# Patient Record
Sex: Male | Born: 1960 | Race: Black or African American | Hispanic: No | Marital: Married | Smoking: Never smoker
Health system: Southern US, Community
[De-identification: ages and names within clinical notes are randomized; demographics above are authoritative.]

## PROBLEM LIST (undated history)

## (undated) DIAGNOSIS — N529 Male erectile dysfunction, unspecified: Secondary | ICD-10-CM

## (undated) HISTORY — DX: Male erectile dysfunction, unspecified: N52.9

---

## 2006-11-06 ENCOUNTER — Ambulatory Visit: Payer: Self-pay | Admitting: Family Medicine

## 2008-12-30 ENCOUNTER — Ambulatory Visit: Payer: Self-pay | Admitting: Family Medicine

## 2010-03-20 HISTORY — PX: COLONOSCOPY: SHX174

## 2010-08-23 ENCOUNTER — Encounter: Payer: Self-pay | Admitting: Family Medicine

## 2010-08-24 ENCOUNTER — Encounter: Payer: Self-pay | Admitting: Family Medicine

## 2010-08-24 ENCOUNTER — Ambulatory Visit (INDEPENDENT_AMBULATORY_CARE_PROVIDER_SITE_OTHER): Payer: 59 | Admitting: Family Medicine

## 2010-08-24 VITALS — BP 120/84 | HR 72 | Ht 74.0 in | Wt 232.0 lb

## 2010-08-24 DIAGNOSIS — Z0000 Encounter for general adult medical examination without abnormal findings: Secondary | ICD-10-CM

## 2010-08-24 DIAGNOSIS — N529 Male erectile dysfunction, unspecified: Secondary | ICD-10-CM | POA: Insufficient documentation

## 2010-08-24 LAB — CBC WITH DIFFERENTIAL/PLATELET
Basophils Absolute: 0 10*3/uL (ref 0.0–0.1)
Basophils Relative: 0 % (ref 0–1)
Eosinophils Absolute: 0.1 10*3/uL (ref 0.0–0.7)
MCH: 27.5 pg (ref 26.0–34.0)
MCHC: 33.6 g/dL (ref 30.0–36.0)
Monocytes Relative: 10 % (ref 3–12)
Neutro Abs: 1.6 10*3/uL — ABNORMAL LOW (ref 1.7–7.7)
Neutrophils Relative %: 44 % (ref 43–77)
Platelets: 259 10*3/uL (ref 150–400)
RDW: 13.7 % (ref 11.5–15.5)

## 2010-08-24 LAB — POCT URINALYSIS DIPSTICK
Bilirubin, UA: NEGATIVE
Blood, UA: NEGATIVE
Leukocytes, UA: NEGATIVE
Nitrite, UA: NEGATIVE
Protein, UA: NEGATIVE
Urobilinogen, UA: NEGATIVE
pH, UA: 5

## 2010-08-24 LAB — COMPREHENSIVE METABOLIC PANEL
AST: 21 U/L (ref 0–37)
Alkaline Phosphatase: 78 U/L (ref 39–117)
Glucose, Bld: 87 mg/dL (ref 70–99)
Potassium: 4.4 mEq/L (ref 3.5–5.3)
Sodium: 138 mEq/L (ref 135–145)
Total Bilirubin: 0.5 mg/dL (ref 0.3–1.2)
Total Protein: 8.1 g/dL (ref 6.0–8.3)

## 2010-08-24 LAB — LIPID PANEL
HDL: 88 mg/dL (ref >39)
LDL Cholesterol: 130 mg/dL — ABNORMAL HIGH (ref 0–99)
Triglycerides: 48 mg/dL (ref <150)
VLDL: 10 mg/dL (ref 0–40)

## 2010-08-24 NOTE — Patient Instructions (Signed)
I will call you with the blood work results.

## 2010-08-24 NOTE — Progress Notes (Signed)
  Subjective:    Patient ID: Wesley Mayo, male    DOB: [birth year], 50 y.o.   MRN: [id number]  HPI he is here for a complete examination. He has no particular concerns or complaints. He does occasionally use Cialis for his underlying erectile dysfunction however has not found the need to use it recently. His work is going well. He does exercise regularly. He does wear his seatbelt. He does have children and they are doing well.   Review of Systems  Constitutional: Negative.   HENT: Negative.   Eyes: Negative.   Respiratory: Negative.   Cardiovascular: Negative.   Gastrointestinal: Negative.   Genitourinary: Negative.   Musculoskeletal: Negative.   Neurological: Negative.   Hematological: Negative.   Psychiatric/Behavioral: Negative.        Objective:   Physical Exam BP 120/84  Pulse 72  Ht 6\' 2"  (1.88 m)  Wt 232 lb (105.235 kg)  BMI 29.79 kg/m2  General Appearance:    Alert, cooperative, no distress, appears stated age  Head:    Normocephalic, without obvious abnormality, atraumatic  Eyes:    PERRL, conjunctiva/corneas clear, EOM's intact, fundi    benign  Ears:    Normal TM's and external ear canals  Nose:   Nares normal, mucosa normal, no drainage or sinus   tenderness  Throat:   Lips, mucosa, and tongue normal; teeth and gums normal  Neck:   Supple, no lymphadenopathy;  thyroid:  no   enlargement/tenderness/nodules; no carotid   bruit or JVD  Back:    Spine nontender, no curvature, ROM normal, no CVA     tenderness  Lungs:     Clear to auscultation bilaterally without wheezes, rales or     ronchi; respirations unlabored  Chest Wall:    No tenderness or deformity   Heart:    Regular rate and rhythm, S1 and S2 normal, no murmur, rub   or gallop  Breast Exam:    No chest wall tenderness, masses or gynecomastia  Abdomen:     Soft, non-tender, nondistended, normoactive bowel sounds,    no masses, no hepatosplenomegaly  Genitalia:    Normal male external genitalia without  lesions.  Testicles without masses.  No inguinal hernias.     Extremities:   No clubbing, cyanosis or edema  Pulses:   2+ and symmetric all extremities  Skin:   Skin color, texture, turgor normal, no rashes or lesions  Lymph nodes:   Cervical, supraclavicular, and axillary nodes normal  Neurologic:   CNII-XII intact, normal strength, sensation and gait; reflexes 2+ and symmetric throughout          Psych:   Normal mood, affect, hygiene and grooming.           Assessment & Plan:  Normal exam with history of erectile dysfunction. Routine blood screening. Discussed PSA testing and he declined. I will also set him up for colonoscopy.

## 2010-08-26 ENCOUNTER — Telehealth: Payer: Self-pay

## 2010-08-26 NOTE — Telephone Encounter (Signed)
Called pt informed labs look good

## 2010-09-13 LAB — HM COLONOSCOPY: HM Colonoscopy: NORMAL

## 2012-11-13 ENCOUNTER — Encounter: Payer: Self-pay | Admitting: Internal Medicine

## 2012-11-26 ENCOUNTER — Ambulatory Visit (INDEPENDENT_AMBULATORY_CARE_PROVIDER_SITE_OTHER): Payer: 59 | Admitting: Family Medicine

## 2012-11-26 ENCOUNTER — Encounter: Payer: Self-pay | Admitting: Family Medicine

## 2012-11-26 VITALS — BP 124/82 | HR 83 | Ht 74.0 in | Wt 239.0 lb

## 2012-11-26 DIAGNOSIS — Z23 Encounter for immunization: Secondary | ICD-10-CM

## 2012-11-26 DIAGNOSIS — Z0000 Encounter for general adult medical examination without abnormal findings: Secondary | ICD-10-CM

## 2012-11-26 DIAGNOSIS — K219 Gastro-esophageal reflux disease without esophagitis: Secondary | ICD-10-CM

## 2012-11-26 DIAGNOSIS — R0683 Snoring: Secondary | ICD-10-CM

## 2012-11-26 DIAGNOSIS — R0609 Other forms of dyspnea: Secondary | ICD-10-CM

## 2012-11-26 LAB — LIPID PANEL
Cholesterol: 228 mg/dL — ABNORMAL HIGH (ref 0–200)
LDL Cholesterol: 112 mg/dL — ABNORMAL HIGH (ref 0–99)
Total CHOL/HDL Ratio: 2.2 Ratio
Triglycerides: 64 mg/dL (ref <150)
VLDL: 13 mg/dL (ref 0–40)

## 2012-11-26 LAB — CBC WITH DIFFERENTIAL/PLATELET
Basophils Relative: 0 % (ref 0–1)
Eosinophils Absolute: 0.2 10*3/uL (ref 0.0–0.7)
MCH: 28.1 pg (ref 26.0–34.0)
MCHC: 34.4 g/dL (ref 30.0–36.0)
Neutro Abs: 1.5 10*3/uL — ABNORMAL LOW (ref 1.7–7.7)
Neutrophils Relative %: 39 % — ABNORMAL LOW (ref 43–77)
Platelets: 265 10*3/uL (ref 150–400)
RBC: 5.19 MIL/uL (ref 4.22–5.81)

## 2012-11-26 LAB — COMPREHENSIVE METABOLIC PANEL
ALT: 27 U/L (ref 0–53)
AST: 21 U/L (ref 0–37)
Alkaline Phosphatase: 79 U/L (ref 39–117)
Sodium: 137 mEq/L (ref 135–145)
Total Bilirubin: 0.4 mg/dL (ref 0.3–1.2)
Total Protein: 8 g/dL (ref 6.0–8.3)

## 2012-11-26 NOTE — Progress Notes (Signed)
  Subjective:    Patient ID: Wesley Mayo, male    DOB: [birth year], 52 y.o.   MRN: [id number]  HPI He is here for complete examination. He does snore and his girlfriend says that he stops breathing sometimes as much is 30-40 seconds at a time. He occasionally will wake up tired. He does not follow sleep behind the wheel. He will occasionally fall asleep watching a movie. He does have reflux and uses Prilosec on an as-needed basis. He has no other concerns or complaints. His medical record was reviewed. He is up-to-date on his colonoscopy, immunizations and blood work. His work and home life are going quite well. He has started a walking exercise program.   Review of Systems  Constitutional: Negative.   HENT: Negative.   Eyes: Negative.   Respiratory: Negative.   Cardiovascular: Negative.   Gastrointestinal: Negative.   Endocrine: Negative.   Genitourinary: Negative.   Musculoskeletal: Negative.   Allergic/Immunologic: Negative.   Neurological: Negative.   Hematological: Negative.   Psychiatric/Behavioral: Negative.        Objective:   Physical Exam BP 124/82  Pulse 83  Ht 6\' 2"  (1.88 m)  Wt 239 lb (108.41 kg)  BMI 30.67 kg/m2  General Appearance:    Alert, cooperative, no distress, appears stated age  Head:    Normocephalic, without obvious abnormality, atraumatic  Eyes:    PERRL, conjunctiva/corneas clear, EOM's intact, fundi    benign  Ears:    Normal TM's and external ear canals  Nose:   Nares normal, mucosa normal, no drainage or sinus   tenderness  Throat:   Lips, mucosa, and tongue normal; teeth and gums normal  Neck:   Supple, no lymphadenopathy;  thyroid:  no   enlargement/tenderness/nodules; no carotid   bruit or JVD  Back:    Spine nontender, no curvature, ROM normal, no CVA     tenderness  Lungs:     Clear to auscultation bilaterally without wheezes, rales or     ronchi; respirations unlabored  Chest Wall:    No tenderness or deformity   Heart:    Regular rate  and rhythm, S1 and S2 normal, no murmur, rub   or gallop     Abdomen:     Soft, non-tender, nondistended, normoactive bowel sounds,    no masses, no hepatosplenomegaly        Extremities:   No clubbing, cyanosis or edema  Pulses:   2+ and symmetric all extremities  Skin:   Skin color, texture, turgor normal, no rashes or lesions  Lymph nodes:   Cervical, supraclavicular, and axillary nodes normal  Neurologic:   CNII-XII intact, normal strength, sensation and gait; reflexes 2+ and symmetric throughout          Psych:   Normal mood, affect, hygiene and grooming.           Assessment & Plan:    Need for prophylactic vaccination and inoculation against influenza - Plan: Flu Vaccine QUAD 36+ mos IM  Snoring - Plan: Nocturnal polysomnography (NPSG)  Routine general medical examination at a health care facility - Plan: CBC with Differential, Comprehensive metabolic panel, Lipid panel  GERD (gastroesophageal reflux disease)    I will pursue this snoring. Encouraged him to continue with his exercise program. Recommended 150 minutes per week of something physical. Need to use the Prilosec on an as-needed basis.

## 2012-11-27 NOTE — Progress Notes (Signed)
Quick Note:  CALLED PT CELL/HOME # LEFT MESSAGE WORD FOR WORD Labs look good ______

## 2013-01-02 ENCOUNTER — Ambulatory Visit (HOSPITAL_BASED_OUTPATIENT_CLINIC_OR_DEPARTMENT_OTHER): Payer: 59

## 2013-08-20 ENCOUNTER — Ambulatory Visit (INDEPENDENT_AMBULATORY_CARE_PROVIDER_SITE_OTHER): Payer: 59 | Admitting: Family Medicine

## 2013-08-20 ENCOUNTER — Encounter: Payer: Self-pay | Admitting: Family Medicine

## 2013-08-20 VITALS — BP 130/74 | Wt 244.0 lb

## 2013-08-20 DIAGNOSIS — D239 Other benign neoplasm of skin, unspecified: Secondary | ICD-10-CM

## 2013-08-20 DIAGNOSIS — D229 Melanocytic nevi, unspecified: Secondary | ICD-10-CM

## 2013-08-20 NOTE — Progress Notes (Signed)
   Subjective:    Patient ID: Wesley Mayo, male    DOB: [birth year], 53 y.o.   MRN: [id number]  HPI He has a mole present on his penis it is interfering with sexual relations.   Review of Systems     Objective:   Physical Exam 1 cm raised pigmented lesion with well-defined margins is noted on the shaft of the penis on the left      Assessment & Plan:  Benign mole - Plan: Ambulatory referral to Urology

## 2014-07-09 ENCOUNTER — Telehealth: Payer: Self-pay | Admitting: Family Medicine

## 2014-07-09 ENCOUNTER — Encounter: Payer: Self-pay | Admitting: Family Medicine

## 2014-07-09 ENCOUNTER — Ambulatory Visit (INDEPENDENT_AMBULATORY_CARE_PROVIDER_SITE_OTHER): Payer: BLUE CROSS/BLUE SHIELD | Admitting: Family Medicine

## 2014-07-09 VITALS — BP 136/74 | HR 80 | Ht 75.5 in | Wt 246.0 lb

## 2014-07-09 DIAGNOSIS — I493 Ventricular premature depolarization: Secondary | ICD-10-CM

## 2014-07-09 DIAGNOSIS — Z0000 Encounter for general adult medical examination without abnormal findings: Secondary | ICD-10-CM

## 2014-07-09 DIAGNOSIS — R635 Abnormal weight gain: Secondary | ICD-10-CM | POA: Diagnosis not present

## 2014-07-09 LAB — CBC WITH DIFFERENTIAL/PLATELET
BASOS ABS: 0 10*3/uL (ref 0.0–0.1)
Basophils Relative: 0 % (ref 0–1)
Eosinophils Absolute: 0.3 10*3/uL (ref 0.0–0.7)
Eosinophils Relative: 5 % (ref 0–5)
HCT: 40.8 % (ref 39.0–52.0)
Hemoglobin: 13.8 g/dL (ref 13.0–17.0)
LYMPHS PCT: 38 % (ref 12–46)
Lymphs Abs: 2 10*3/uL (ref 0.7–4.0)
MCH: 27.4 pg (ref 26.0–34.0)
MCHC: 33.8 g/dL (ref 30.0–36.0)
MCV: 81.1 fL (ref 78.0–100.0)
MPV: 9.2 fL (ref 8.6–12.4)
Monocytes Absolute: 0.6 10*3/uL (ref 0.1–1.0)
Monocytes Relative: 12 % (ref 3–12)
Neutro Abs: 2.4 10*3/uL (ref 1.7–7.7)
Neutrophils Relative %: 45 % (ref 43–77)
PLATELETS: 266 10*3/uL (ref 150–400)
RBC: 5.03 MIL/uL (ref 4.22–5.81)
RDW: 14.2 % (ref 11.5–15.5)
WBC: 5.3 10*3/uL (ref 4.0–10.5)

## 2014-07-09 LAB — POCT URINALYSIS DIPSTICK
Bilirubin, UA: NEGATIVE
Glucose, UA: NEGATIVE
KETONES UA: NEGATIVE
Leukocytes, UA: NEGATIVE
NITRITE UA: NEGATIVE
PH UA: 6
Protein, UA: NEGATIVE
RBC UA: NEGATIVE
Spec Grav, UA: >=1.03
Urobilinogen, UA: NEGATIVE

## 2014-07-09 NOTE — Telephone Encounter (Signed)
Called pt and advised of Dr. Wynonia Lawman May 4th at 9:45 [phone] to take his med bottles

## 2014-07-09 NOTE — Progress Notes (Signed)
   Subjective:    Patient ID: Wesley Mayo, male    DOB: [birth year], 54 y.o.   MRN: [id number]  HPI He is here for complete examination. He has noted recent weight gain and states this is due to her recent job change which makes him much more sedentary. He is also divorced he has handled this well and has had no issues concerning this. Presently is not sexually active. Had no chest pain, shortness of breath, DOE or PND. His father did have a stent at age 36. Social and family history as well as health maintenance and immunizations were reviewed.  Review of Systems  All other systems reviewed and are negative.      Objective:   Physical Exam Alert and in no distress.undyed benign Tympanic membranes and canals are normal. Pharyngeal area is normal. Neck is supple without adenopathy or thyromegaly. Cardiac exam shows an irregular rhythm without murmurs or gallops. Lungs are clear to auscultation.abdominal exam shows no hepatosplenomegaly, masses or tenderness. EKG does show multiple unifocal PVCs        Assessment & Plan:  Annual physical exam - Plan: Urinalysis Dipstick, EKG 12-Lead, CBC with Differential/Platelet, Comprehensive metabolic panel, Lipid panel, PSA, Ambulatory referral to Cardiology  Frequent PVCs - Plan: EKG 12-Lead, Ambulatory referral to Cardiology  Weight gain cardiology referral will be made to further evaluate the PVCs. Also discussed diet and exercise modification. Discussed cutting back on carbohydrates as well as utilizing his morning, lunch and afternoon breaks more efficiently in terms of taking walks. Follow-up pending cardiology evaluation.

## 2014-07-09 NOTE — Patient Instructions (Signed)
Use your breaks to walk.instead of the snack machine take something from home. Look at the word breakfast and what means "break fast". Down below the waist size of 36 with the flat belly

## 2014-07-10 LAB — COMPREHENSIVE METABOLIC PANEL
ALBUMIN: 4.4 g/dL (ref 3.5–5.2)
ALT: 26 U/L (ref 0–53)
AST: 20 U/L (ref 0–37)
Alkaline Phosphatase: 86 U/L (ref 39–117)
BUN: 14 mg/dL (ref 6–23)
CHLORIDE: 99 meq/L (ref 96–112)
CO2: 24 mEq/L (ref 19–32)
CREATININE: 1.2 mg/dL (ref 0.50–1.35)
Calcium: 9.4 mg/dL (ref 8.4–10.5)
Glucose, Bld: 82 mg/dL (ref 70–99)
Potassium: 3.8 mEq/L (ref 3.5–5.3)
SODIUM: 137 meq/L (ref 135–145)
TOTAL PROTEIN: 7.7 g/dL (ref 6.0–8.3)
Total Bilirubin: 0.5 mg/dL (ref 0.2–1.2)

## 2014-07-10 LAB — PSA: PSA: 0.86 ng/mL (ref <=4.00)

## 2014-07-10 LAB — LIPID PANEL
CHOL/HDL RATIO: 2.2 ratio
CHOLESTEROL: 217 mg/dL — AB (ref 0–200)
HDL: 99 mg/dL (ref >=40)
LDL Cholesterol: 107 mg/dL — ABNORMAL HIGH (ref 0–99)
TRIGLYCERIDES: 56 mg/dL (ref <150)
VLDL: 11 mg/dL (ref 0–40)

## 2014-07-28 ENCOUNTER — Ambulatory Visit (INDEPENDENT_AMBULATORY_CARE_PROVIDER_SITE_OTHER): Payer: BLUE CROSS/BLUE SHIELD | Admitting: Family Medicine

## 2014-07-28 ENCOUNTER — Encounter: Payer: Self-pay | Admitting: Family Medicine

## 2014-07-28 VITALS — BP 140/98 | HR 88 | Wt 245.0 lb

## 2014-07-28 DIAGNOSIS — I10 Essential (primary) hypertension: Secondary | ICD-10-CM

## 2014-07-28 MED ORDER — LISINOPRIL 10 MG PO TABS: 10.0000 mg | ORAL_TABLET | Freq: Every day | ORAL | Status: DC

## 2014-07-28 NOTE — Patient Instructions (Signed)
If you develop a cough or noticed any swelling let me know

## 2014-07-28 NOTE — Progress Notes (Signed)
   Subjective:    Patient ID: Wesley Mayo, male    DOB: May 28, 1960, 54 y.o.   MRN: [id number]  HPI He was seen yesterday by Dr. Wynonia Lawman and apparently the echocardiogram did show some LVH. He was sent here for further care of this LVH less hypertension.   Review of Systems     Objective:   Physical Exam Alert and in no distress otherwise not examined       Assessment & Plan:  Essential hypertension - Plan: lisinopril (PRINIVIL,ZESTRIL) 10 MG tablet I discussed the diagnosis of hypertension and its treatment, especially in regard to his LVH. I discussed the benefits in regard to decrease risk of stroke, heart failure and kidney failure.I will place him on lisinopril. Discussed possible side effects. He will call if he has swelling or cough. Otherwise I will see him in one month.

## 2014-08-04 ENCOUNTER — Encounter: Payer: Self-pay | Admitting: Family Medicine

## 2015-08-25 ENCOUNTER — Other Ambulatory Visit: Payer: Self-pay | Admitting: Family Medicine

## 2016-01-19 ENCOUNTER — Other Ambulatory Visit: Payer: Self-pay | Admitting: Medical

## 2016-01-19 NOTE — Telephone Encounter (Signed)
Pt coming in next week on 01/24/17 for med check

## 2016-01-25 ENCOUNTER — Ambulatory Visit (INDEPENDENT_AMBULATORY_CARE_PROVIDER_SITE_OTHER): Payer: 59 | Admitting: Family Medicine

## 2016-01-25 ENCOUNTER — Encounter: Payer: Self-pay | Admitting: Family Medicine

## 2016-01-25 VITALS — BP 142/90 | HR 90 | Ht 76.0 in | Wt 248.0 lb

## 2016-01-25 DIAGNOSIS — Z79899 Other long term (current) drug therapy: Secondary | ICD-10-CM | POA: Diagnosis not present

## 2016-01-25 DIAGNOSIS — I10 Essential (primary) hypertension: Secondary | ICD-10-CM

## 2016-01-25 DIAGNOSIS — Z1159 Encounter for screening for other viral diseases: Secondary | ICD-10-CM | POA: Diagnosis not present

## 2016-01-25 DIAGNOSIS — J309 Allergic rhinitis, unspecified: Secondary | ICD-10-CM | POA: Diagnosis not present

## 2016-01-25 DIAGNOSIS — K219 Gastro-esophageal reflux disease without esophagitis: Secondary | ICD-10-CM

## 2016-01-25 LAB — CBC WITH DIFFERENTIAL/PLATELET
Basophils Absolute: 0 cells/uL (ref 0–200)
Basophils Relative: 0 %
EOS ABS: 138 {cells}/uL (ref 15–500)
Eosinophils Relative: 3 %
HEMATOCRIT: 40.6 % (ref 38.5–50.0)
HEMOGLOBIN: 13.9 g/dL (ref 13.2–17.1)
Lymphocytes Relative: 45 %
Lymphs Abs: 2070 cells/uL (ref 850–3900)
MCH: 28.4 pg (ref 27.0–33.0)
MCHC: 34.2 g/dL (ref 32.0–36.0)
MCV: 82.9 fL (ref 80.0–100.0)
MONO ABS: 460 {cells}/uL (ref 200–950)
MPV: 8.8 fL (ref 7.5–12.5)
Monocytes Relative: 10 %
NEUTROS PCT: 42 %
Neutro Abs: 1932 cells/uL (ref 1500–7800)
Platelets: 243 10*3/uL (ref 140–400)
RBC: 4.9 MIL/uL (ref 4.20–5.80)
RDW: 14.2 % (ref 11.0–15.0)
WBC: 4.6 10*3/uL (ref 4.0–10.5)

## 2016-01-25 LAB — COMPREHENSIVE METABOLIC PANEL
ALBUMIN: 4.4 g/dL (ref 3.6–5.1)
ALT: 26 U/L (ref 9–46)
AST: 20 U/L (ref 10–35)
Alkaline Phosphatase: 72 U/L (ref 40–115)
BUN: 15 mg/dL (ref 7–25)
CALCIUM: 10 mg/dL (ref 8.6–10.3)
CO2: 28 mmol/L (ref 20–31)
Chloride: 101 mmol/L (ref 98–110)
Creat: 1.31 mg/dL (ref 0.70–1.33)
GLUCOSE: 102 mg/dL — AB (ref 65–99)
POTASSIUM: 4.3 mmol/L (ref 3.5–5.3)
Sodium: 138 mmol/L (ref 135–146)
Total Bilirubin: 0.4 mg/dL (ref 0.2–1.2)
Total Protein: 7.7 g/dL (ref 6.1–8.1)

## 2016-01-25 LAB — LIPID PANEL
CHOL/HDL RATIO: 2 ratio (ref <5.0)
Cholesterol: 231 mg/dL — ABNORMAL HIGH (ref <200)
HDL: 117 mg/dL (ref >40)
LDL CALC: 103 mg/dL — AB
TRIGLYCERIDES: 56 mg/dL (ref <150)
VLDL: 11 mg/dL (ref <30)

## 2016-01-25 MED ORDER — LISINOPRIL 10 MG PO TABS: 10.0000 mg | ORAL_TABLET | Freq: Every day | ORAL | 3 refills | Status: DC

## 2016-01-25 NOTE — Progress Notes (Signed)
   Subjective:    Patient ID: Wesley Mayo, male    DOB: 1960-11-15, 55 y.o.   MRN: [id number]  HPI He is here for medication check. Since last being seen he did get a new job and he is now working third shift and trying to get adjusted to that. He also is now divorced but in the relationship that seems be going well. He did have a previous history of difficulty with ED but not at the present time. He did have difficulty with allergies but none recently. He treats his reflux symptoms with Prilosec on an as-needed basis. He does not smoke and does drink wine but according to him not in excess. His work is going well. Health maintenance and immunizations were reviewed.   Review of Systems     Objective:   Physical Exam Alert and in no distress. Tympanic membranes and canals are normal. Pharyngeal area is normal. Neck is supple without adenopathy or thyromegaly. Cardiac exam shows a regular sinus rhythm without murmurs or gallops. Lungs are clear to auscultation. Abdominal exam shows no masses or tenderness with normal bowel sounds        Assessment & Plan:  Essential hypertension - Plan: CBC with Differential/Platelet, Comprehensive metabolic panel, lisinopril (PRINIVIL,ZESTRIL) 10 MG tablet  Gastroesophageal reflux disease without esophagitis - Plan: CBC with Differential/Platelet, Comprehensive metabolic panel  Allergic rhinitis, unspecified chronicity, unspecified seasonality, unspecified trigger  Encounter for long-term (current) use of medications - Plan: CBC with Differential/Platelet, Comprehensive metabolic panel, Lipid panel  Need for hepatitis C screening test - Plan: Hepatitis C antibody Encouraged him to continue to take good care of himself. Did discuss his weight with him and encouraged him to make diet and exercise changes.

## 2016-01-26 LAB — HEPATITIS C ANTIBODY: HCV AB: NEGATIVE

## 2016-12-11 ENCOUNTER — Encounter: Payer: Self-pay | Admitting: Family Medicine

## 2016-12-11 ENCOUNTER — Ambulatory Visit (INDEPENDENT_AMBULATORY_CARE_PROVIDER_SITE_OTHER): Payer: 59 | Admitting: Family Medicine

## 2016-12-11 ENCOUNTER — Telehealth: Payer: Self-pay

## 2016-12-11 VITALS — BP 140/70 | HR 80 | Resp 16 | Wt 247.8 lb

## 2016-12-11 DIAGNOSIS — J209 Acute bronchitis, unspecified: Secondary | ICD-10-CM

## 2016-12-11 DIAGNOSIS — G4726 Circadian rhythm sleep disorder, shift work type: Secondary | ICD-10-CM

## 2016-12-11 MED ORDER — ZOLPIDEM TARTRATE 10 MG PO TABS: 10.0000 mg | ORAL_TABLET | Freq: Every evening | ORAL | 1 refills | Status: AC | PRN

## 2016-12-11 MED ORDER — AMOXICILLIN 875 MG PO TABS: 875.0000 mg | ORAL_TABLET | Freq: Two times a day (BID) | ORAL | 0 refills | Status: DC

## 2016-12-11 NOTE — Progress Notes (Signed)
   Subjective:    Patient ID: Wesley Mayo, male    DOB: [birth year], 56 y.o.   MRN: [id number]  HPI he complains of a five-week history started with slight sore throat, cough, fever and chills but no nasal congestion, rhinorrhea, sneezing, earache or shortness of breath. The cough is now intermittent and only as symptoms have disappeared. He does not smoke and has no allergies. He is also now working third shift and having difficulty with getting into a regular sleep pattern. He will lay in bed after working third shift and only get 3-4 hour sleep.  Review of Systems     Objective:   Physical Exam Alert and in no distress. Tympanic membranes and canals are normal. Pharyngeal area is normal. Neck is supple without adenopathy or thyromegaly. Cardiac exam shows a regular sinus rhythm without murmurs or gallops. Lungs are clear to auscultation.        Assessment & Plan:  Acute bronchitis, unspecified organism - Plan: amoxicillin (AMOXIL) 875 MG tablet  Sleep disorder, shift work - Plan: zolpidem (AMBIEN) 10 MG tablet He is to take all the antibiotic and call me if no better. Also gave instructions on proper use of the Ambien using it mainly the first first 2 nights on third shift to see if this will help get him into a regular sleep-wake pattern.

## 2016-12-11 NOTE — Telephone Encounter (Signed)
Called in Ambien per [employer]

## 2016-12-11 NOTE — Patient Instructions (Addendum)
Take all the antibiotic and if not totally back to normal when you finish call me Take the sleeping medicine for the first 2 nights to see if that's enough to get she will in a regular cycle

## 2016-12-25 ENCOUNTER — Telehealth: Payer: Self-pay | Admitting: Family Medicine

## 2016-12-25 DIAGNOSIS — J209 Acute bronchitis, unspecified: Secondary | ICD-10-CM

## 2016-12-25 MED ORDER — AMOXICILLIN 875 MG PO TABS: 875.0000 mg | ORAL_TABLET | Freq: Two times a day (BID) | ORAL | 0 refills | Status: DC

## 2016-12-25 NOTE — Telephone Encounter (Signed)
Pt called and his cough is not better, he has sore throat and head ache.  Please call in 2nd round antibiotic to [facility] in [place]

## 2017-01-16 ENCOUNTER — Telehealth: Payer: Self-pay | Admitting: Family Medicine

## 2017-01-16 NOTE — Telephone Encounter (Signed)
Call and set him up to get a chest x-ray and then come in to be seen after that.

## 2017-01-16 NOTE — Telephone Encounter (Signed)
Called and notified pt he is going  Have x-ray done and coming in for an appt. On 01/17/2017

## 2017-01-16 NOTE — Telephone Encounter (Signed)
Cough still not gone after 2nd round of antibiotic. Still feels chest congestion and coughs out phlegm sometimes.

## 2017-01-17 ENCOUNTER — Other Ambulatory Visit: Payer: Self-pay | Admitting: Family Medicine

## 2017-01-17 ENCOUNTER — Other Ambulatory Visit: Payer: Self-pay

## 2017-01-17 ENCOUNTER — Ambulatory Visit
Admission: RE | Admit: 2017-01-17 | Discharge: 2017-01-17 | Disposition: A | Discharge status: Home / Self Care | Payer: 59 | Source: Ambulatory Visit | Attending: Family Medicine | Admitting: Family Medicine

## 2017-01-17 ENCOUNTER — Ambulatory Visit (INDEPENDENT_AMBULATORY_CARE_PROVIDER_SITE_OTHER): Payer: 59 | Admitting: Family Medicine

## 2017-01-17 VITALS — BP 130/80 | HR 81 | Temp 98.1°F | Resp 16 | Wt 246.0 lb

## 2017-01-17 DIAGNOSIS — R059 Cough, unspecified: Secondary | ICD-10-CM

## 2017-01-17 DIAGNOSIS — R05 Cough: Secondary | ICD-10-CM

## 2017-01-17 DIAGNOSIS — R053 Chronic cough: Secondary | ICD-10-CM

## 2017-01-17 LAB — CBC WITH DIFFERENTIAL/PLATELET
BASOS ABS: 29 {cells}/uL (ref 0–200)
Basophils Relative: 0.5 %
EOS ABS: 151 {cells}/uL (ref 15–500)
Eosinophils Relative: 2.6 %
HEMATOCRIT: 39.2 % (ref 38.5–50.0)
HEMOGLOBIN: 13.6 g/dL (ref 13.2–17.1)
LYMPHS ABS: 2523 {cells}/uL (ref 850–3900)
MCH: 27.4 pg (ref 27.0–33.0)
MCHC: 34.7 g/dL (ref 32.0–36.0)
MCV: 79 fL — ABNORMAL LOW (ref 80.0–100.0)
MPV: 10 fL (ref 7.5–12.5)
Monocytes Relative: 9.4 %
NEUTROS ABS: 2552 {cells}/uL (ref 1500–7800)
Neutrophils Relative %: 44 %
Platelets: 294 10*3/uL (ref 140–400)
RBC: 4.96 10*6/uL (ref 4.20–5.80)
RDW: 14.2 % (ref 11.0–15.0)
Total Lymphocyte: 43.5 %
WBC mixed population: 545 cells/uL (ref 200–950)
WBC: 5.8 10*3/uL (ref 3.8–10.8)

## 2017-01-17 NOTE — Progress Notes (Addendum)
   Subjective:    Patient ID: Wesley Mayo, male    DOB: 01/22/1961, 56 y.o.   MRN: [id number]  HPI He is here for evaluation of continued difficulty with the cough. He was seen on September 24 and treated with Amoxil for a 5 week history of cough. This was repeated on 10 8. He states that he is really only 20% better. Continues to cough and does note worsening when he talks. No fever, chills, sore throat, earache, shortness of breath. The cough does not change with position and he cannot relate this to eating.   Review of Systems     Objective:   Physical Exam Alert and in no distress. Tympanic membranes and canals are normal. Pharyngeal area is normal. Neck is supple without adenopathy or thyromegaly. Cardiac exam shows a regular sinus rhythm without murmurs or gallops. Lungs are clear to auscultation. Chest x-ray is negative       Assessment & Plan:  Cough - Plan: CBC with Differential/Platelet  If the CBC is normal, I will probably place him on a different antibiotic and if still no improvement, refer to pulmonary.  The blood work looks good. I will switch him to Levaquin and if no improvement refer to pulmonary

## 2017-01-18 MED ORDER — LEVOFLOXACIN 500 MG PO TABS: 500.0000 mg | ORAL_TABLET | Freq: Every day | ORAL | 0 refills | Status: DC

## 2017-01-18 NOTE — Addendum Note (Signed)
Addended by: Denita Lung on: 01/18/2017 08:16 AM   Modules accepted: Orders

## 2017-01-27 ENCOUNTER — Other Ambulatory Visit: Payer: Self-pay | Admitting: Family Medicine

## 2017-01-27 DIAGNOSIS — I10 Essential (primary) hypertension: Secondary | ICD-10-CM

## 2017-01-29 NOTE — Telephone Encounter (Signed)
LM due for med check. 30 day supply sent in.

## 2017-02-05 ENCOUNTER — Ambulatory Visit: Payer: 59 | Admitting: Family Medicine

## 2017-02-05 ENCOUNTER — Encounter: Payer: Self-pay | Admitting: Family Medicine

## 2017-02-05 VITALS — BP 130/70 | HR 91 | Temp 98.2°F | Resp 16 | Wt 247.6 lb

## 2017-02-05 DIAGNOSIS — J209 Acute bronchitis, unspecified: Secondary | ICD-10-CM | POA: Diagnosis not present

## 2017-02-05 MED ORDER — LEVOFLOXACIN 500 MG PO TABS: 500.0000 mg | ORAL_TABLET | Freq: Every day | ORAL | 0 refills | Status: DC

## 2017-02-05 NOTE — Progress Notes (Signed)
   Subjective:    Patient ID: Wesley Mayo, male    DOB: Nov 20, 1960, 56 y.o.   MRN: [id number]  HPI He is here for recheck.  He states that the last antibiotic grossly 80% better but he did not call for refill.  He is again having difficulty with cough and congestion but no fever, chills, sore throat or earache.   Review of Systems     Objective:   Physical Exam Alert and in no distress. Tympanic membranes and canals are normal. Pharyngeal area is normal. Neck is supple without adenopathy or thyromegaly. Cardiac exam shows a regular sinus rhythm without murmurs or gallops. Lungs show scattered rales and rhonchi.        Assessment & Plan:  Acute bronchitis, unspecified organism - Plan: levofloxacin (LEVAQUIN) 500 MG tablet  I will give place him on the antibiotic and encouraged him to call me if he is not totally back to normal.

## 2017-02-16 ENCOUNTER — Telehealth: Payer: Self-pay | Admitting: Family Medicine

## 2017-02-16 DIAGNOSIS — J209 Acute bronchitis, unspecified: Secondary | ICD-10-CM

## 2017-02-16 NOTE — Telephone Encounter (Signed)
Pt called and states that he is only about 90 percent better he finished his antibiotic Monday states he is still coughing and is wondering if you will send him another round of antibiotic in pt uses [place], [place] - [address] AT [place] pt can be reached at [phone]

## 2017-02-18 MED ORDER — LEVOFLOXACIN 500 MG PO TABS
500.0000 mg | ORAL_TABLET | Freq: Every day | ORAL | 0 refills | Status: DC
Start: 2017-02-18 — End: 2017-05-16

## 2017-03-02 ENCOUNTER — Other Ambulatory Visit: Payer: Self-pay | Admitting: Family Medicine

## 2017-03-02 DIAGNOSIS — I10 Essential (primary) hypertension: Secondary | ICD-10-CM

## 2017-03-30 ENCOUNTER — Other Ambulatory Visit: Payer: Self-pay | Admitting: Family Medicine

## 2017-03-30 DIAGNOSIS — I10 Essential (primary) hypertension: Secondary | ICD-10-CM

## 2017-04-27 ENCOUNTER — Other Ambulatory Visit: Payer: Self-pay | Admitting: Family Medicine

## 2017-04-27 DIAGNOSIS — I10 Essential (primary) hypertension: Secondary | ICD-10-CM

## 2017-05-10 ENCOUNTER — Ambulatory Visit: Payer: 59 | Admitting: Family Medicine

## 2017-05-10 ENCOUNTER — Encounter: Payer: Self-pay | Admitting: Family Medicine

## 2017-05-10 VITALS — BP 118/76 | HR 94 | Wt 252.8 lb

## 2017-05-10 DIAGNOSIS — M1812 Unilateral primary osteoarthritis of first carpometacarpal joint, left hand: Secondary | ICD-10-CM | POA: Diagnosis not present

## 2017-05-10 MED ORDER — TRIAMCINOLONE ACETONIDE 40 MG/ML IJ SUSP
40.0000 mg | Freq: Once | INTRAMUSCULAR | Status: AC
  Administered 2017-05-10: 40 mg via INTRAMUSCULAR

## 2017-05-10 MED ORDER — LIDOCAINE HCL 2 % IJ SOLN
10.0000 mL | Freq: Once | INTRAMUSCULAR | Status: AC
  Administered 2017-05-10: 200 mg via INTRADERMAL

## 2017-05-10 NOTE — Addendum Note (Signed)
Addended by: Elyse Jarvis on: 05/10/2017 03:12 PM   Modules accepted: Orders

## 2017-05-10 NOTE — Progress Notes (Signed)
   Subjective:    Patient ID: Wesley Mayo, male    DOB: 1960-06-15, 57 y.o.   MRN: [id number]  HPI He complains of a 4-week history of difficulty with left wrist pain.  He points to the base of the thumb and wrist area.  No history of overuse or injury.   Review of Systems     Objective:   Physical Exam Full motion of the wrist with some tenderness to palpation over the carpal metacarpal joint.  Full motion of the thumb without pain except in that area.       Assessment & Plan:  Arthritis of carpometacarpal [facility]) joint of left thumb I discussed treating this conservatively versus giving an injection.  He first to have an injection.  The joint space was identified, Betadine was used to clean the area.  20 mg of Kenalog and 1 cc of Xylocaine was injected into the space without difficulty.  He did get some relatively quick relief of his symptoms.

## 2017-05-16 ENCOUNTER — Encounter: Payer: Self-pay | Admitting: Medical

## 2017-05-16 ENCOUNTER — Ambulatory Visit: Payer: 59 | Admitting: Medical

## 2017-05-16 VITALS — BP 132/80 | HR 97 | Wt 254.4 lb

## 2017-05-16 DIAGNOSIS — M659 Synovitis and tenosynovitis, unspecified: Secondary | ICD-10-CM | POA: Diagnosis not present

## 2017-05-16 NOTE — Progress Notes (Signed)
Subjective:  Chief Complaint  Patient presents with  . Follow-up    follow up left wrist , still having pain not any better    Here for complaint of left lateral wrist pain.  Saw Dr. [facility]mond [facility] for this a week ago and had an injection of steroid.  This gave pain relief for about 2 days but then the pain came right back.  The pain started about a 5 weeks ago.  Denies injury, trauma, no prior wrist issues.  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  Using some over-the-counter ibuprofen without much improvement  No other aggravating or relieving factors. No other complaint.  Past Medical History:  Diagnosis Date  . ED (erectile dysfunction)    Current Outpatient Medications on File Prior to Visit  Medication Sig Dispense Refill  . fexofenadine-pseudoephedrine (ALLEGRA-D 24) 180-240 MG per 24 hr tablet Take 1 tablet by mouth as needed.    Marland Kitchen lisinopril (PRINIVIL,ZESTRIL) 10 MG tablet TAKE 1 TABLET(10 MG) BY MOUTH DAILY 30 tablet 0  . omeprazole (PRILOSEC) 20 MG capsule Take 20 mg by mouth daily.      Marland Kitchen zolpidem (AMBIEN) 10 MG tablet Take 1 tablet (10 mg total) by mouth at bedtime as needed for sleep. 15 tablet 1   No current facility-administered medications on file prior to visit.     ROS as in subjective   Objective: BP 132/80   Pulse 97   Wt 254 lb 6.4 oz (115.4 kg)   SpO2 97%   BMI 30.97 kg/m   Gen: wd, wn, nad Skin: unremarkable MSK: Tender over the left lateral wrist along the thumb extensor tendons and wrist ligaments, pain with active and resisted wrist inversion.  Otherwise wrist nontender, no swelling, no deformity, rest of hand and fingers and wrist and forearm unremarkable  Normal pulses and cap refill of hands Normal sensation and strength of wrist and hands and fingers   Assessment: Encounter Diagnosis  Name Primary?  . Tenosynovitis of left wrist Yes    Plan: Your symptoms and exam findings suggest tenosynovitis of the left  wrist.    Recommendations:  For the next 7-10 days I recommend resting the left arm as much as possible   I recommend you use the thumb spica splint to the left wrist day and night to support the wrist and limit range of motion of the wrist and thumb  Use over-the-counter ibuprofen 3-4 tablets twice daily for the next 7-10 days for pain and inflammation  Use a combination of ice and heat.  For example use a bag of frozen peas for 20 minutes to the wrist.   Then let the wrist warm up to room temperature.  Then use a warm towel or heat pad for 20 minute.  You can do this alternating ice and heat combo twice daily  For additional rest you can use an arm sling for an hour or 2 at a time throughout the day  If you are much improved in the next 7-10 days, then back off on the ibuprofen to as needed, then only use the splint at nighttime for another week, and start using of stretching and range of motion with the wrist as we discussed  If not much improved in 7-10 days call back or return

## 2017-05-27 ENCOUNTER — Other Ambulatory Visit: Payer: Self-pay | Admitting: Family Medicine

## 2017-05-27 DIAGNOSIS — I10 Essential (primary) hypertension: Secondary | ICD-10-CM

## 2017-05-28 ENCOUNTER — Telehealth: Payer: Self-pay

## 2017-05-28 NOTE — Telephone Encounter (Signed)
Called pt to ask if we could schedule a cpe. No answer . [place]

## 2017-07-02 ENCOUNTER — Other Ambulatory Visit: Payer: Self-pay | Admitting: Family Medicine

## 2017-07-02 DIAGNOSIS — I10 Essential (primary) hypertension: Secondary | ICD-10-CM

## 2017-07-18 ENCOUNTER — Encounter: Payer: 59 | Admitting: Family Medicine

## 2017-08-02 ENCOUNTER — Telehealth: Payer: Self-pay

## 2017-08-02 DIAGNOSIS — I10 Essential (primary) hypertension: Secondary | ICD-10-CM

## 2017-08-02 MED ORDER — LISINOPRIL 10 MG PO TABS: ORAL_TABLET | ORAL | 1 refills | Status: AC

## 2017-08-02 NOTE — Telephone Encounter (Signed)
Received fax refill request for lisinopril 10 mg tabs to be sent to [facility].

## 2018-10-29 IMAGING — CR DG CHEST 2V
2 series · 2 of 2 positions shown · non-contrast
Comparison: None in PACs

CLINICAL DATA: Persistent cough since an upper respiratory
infection with fever 2 months ago.

EXAM:
CHEST  2 VIEW

[w chest pa]
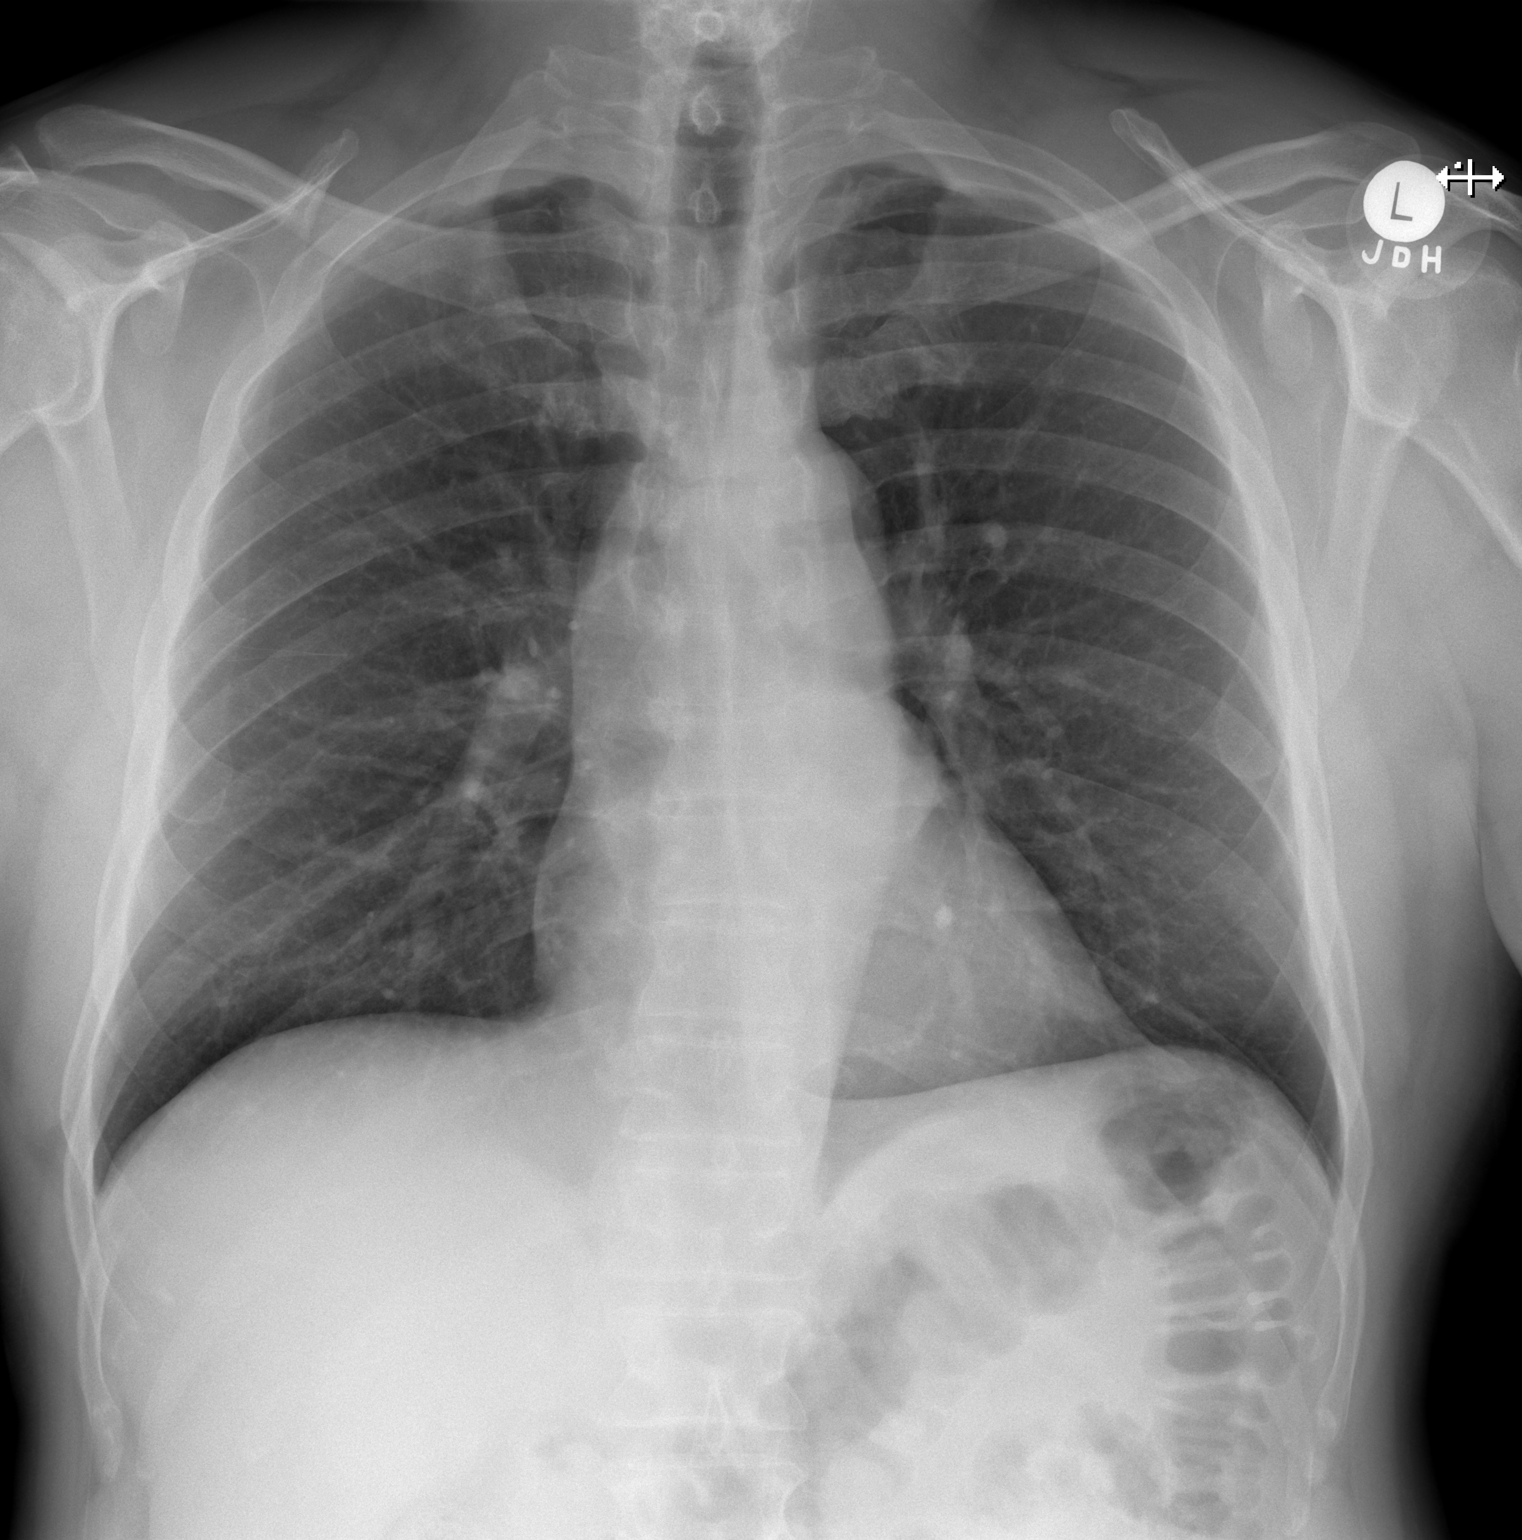

[w chest lat]
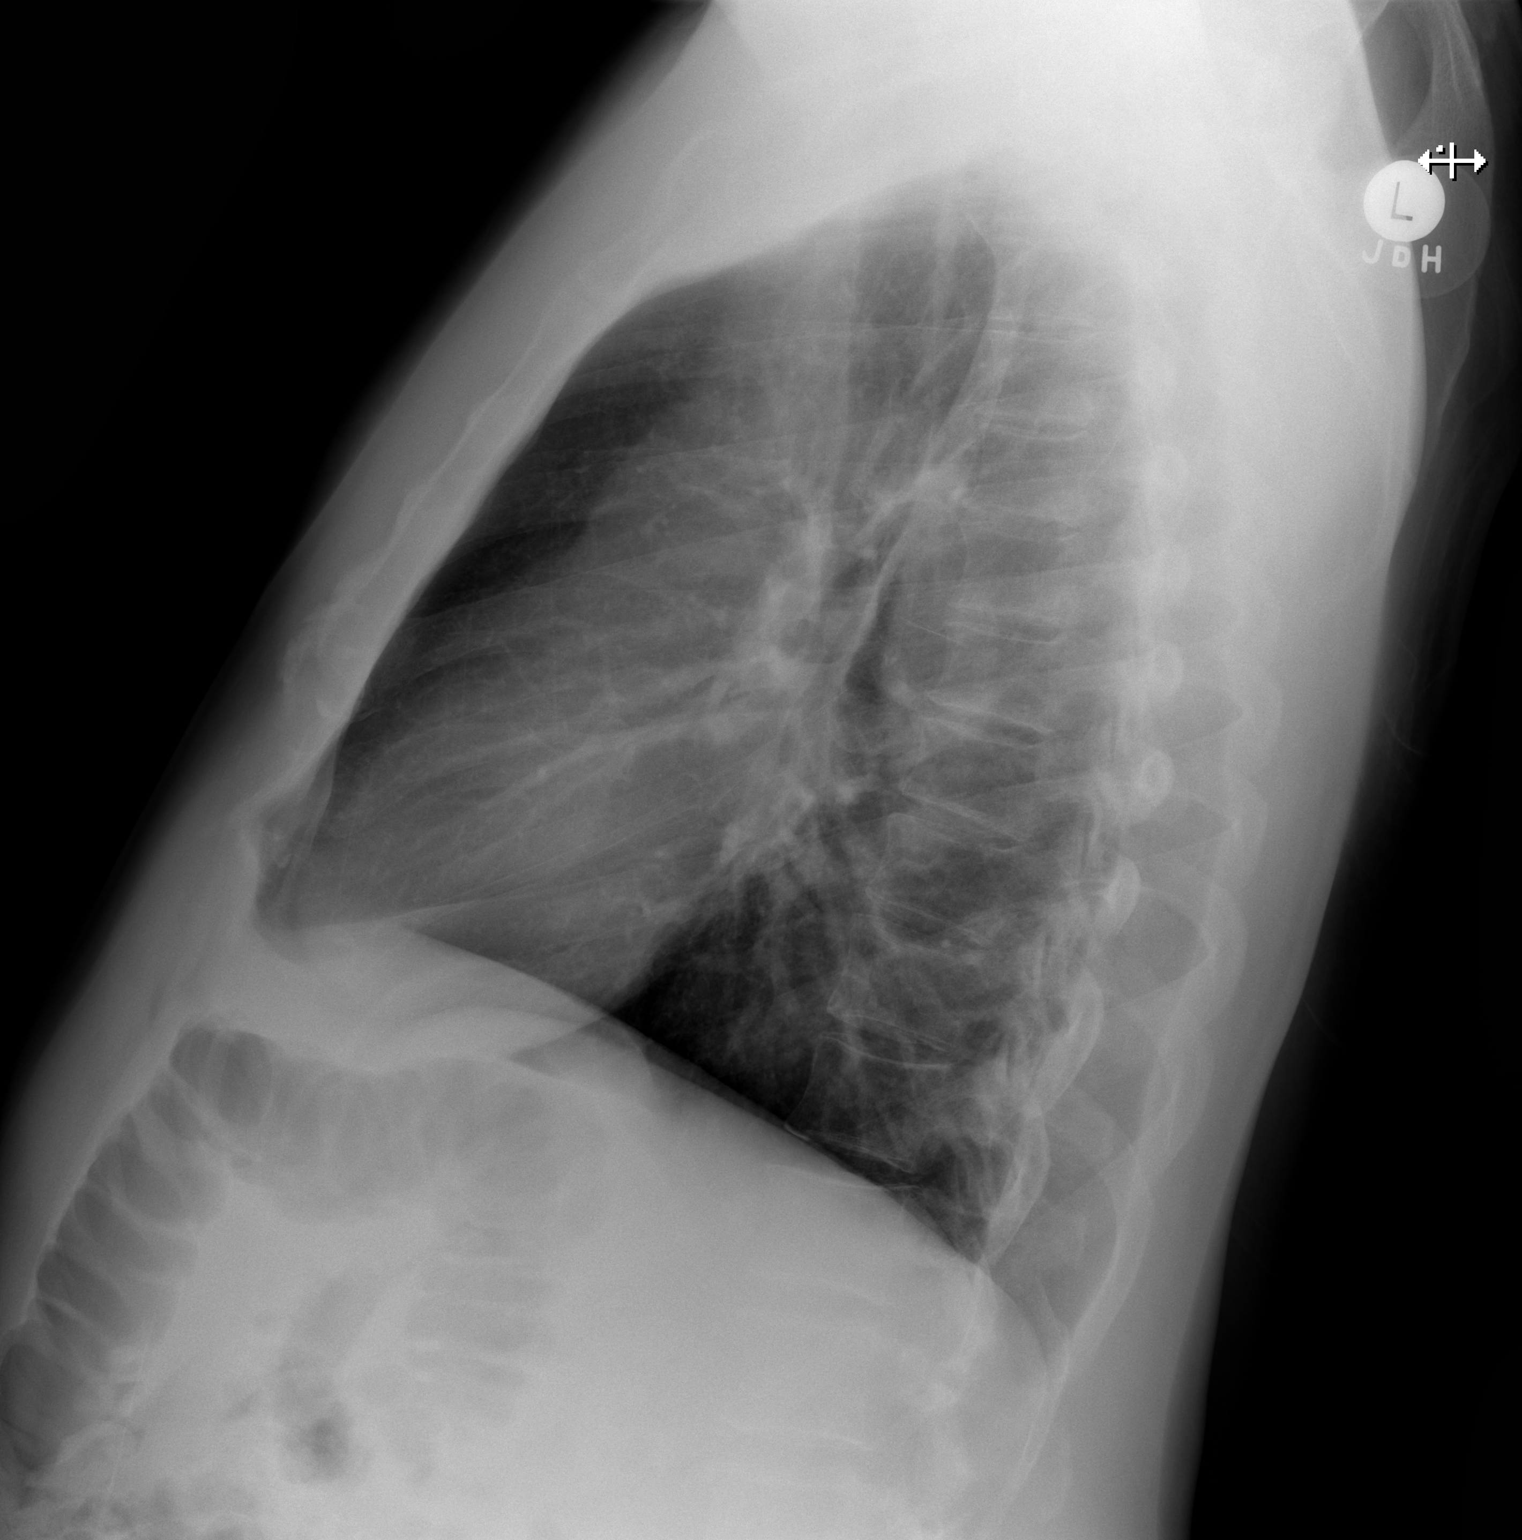

[2 of 2 positions shown; findings below may reference images not displayed]

FINDINGS: The lungs are well-expanded and clear. The heart and pulmonary
vascularity are normal. The mediastinum is normal in width. There is
no pleural effusion. The trachea is midline. The bony thorax
exhibits no acute abnormality.
IMPRESSION: There is no acute cardiopulmonary abnormality.
